# Patient Record
Sex: Female | Born: 1975 | Race: Black or African American | Hispanic: No | Marital: Single | Smoking: Current every day smoker
Health system: Southern US, Community
[De-identification: ages and names within clinical notes are randomized; demographics above are authoritative.]

## PROBLEM LIST (undated history)

## (undated) DIAGNOSIS — E0590 Thyrotoxicosis, unspecified without thyrotoxic crisis or storm: Secondary | ICD-10-CM

## (undated) DIAGNOSIS — I10 Essential (primary) hypertension: Secondary | ICD-10-CM

## (undated) HISTORY — PX: FINGER SURGERY: SHX640

## (undated) HISTORY — DX: Essential (primary) hypertension: I10

## (undated) HISTORY — PX: ABLATION: SHX5711

## (undated) HISTORY — DX: Thyrotoxicosis, unspecified without thyrotoxic crisis or storm: E05.90

## (undated) HISTORY — PX: APPENDECTOMY: SHX54

## (undated) HISTORY — PX: CYSTECTOMY: SUR359

---

## 2006-07-23 ENCOUNTER — Ambulatory Visit: Payer: Self-pay | Admitting: Obstetrics and Gynecology

## 2006-07-27 ENCOUNTER — Ambulatory Visit (HOSPITAL_COMMUNITY): Admission: RE | Admit: 2006-07-27 | Discharge: 2006-07-27 | Payer: Self-pay | Admitting: Obstetrics and Gynecology

## 2006-08-02 ENCOUNTER — Inpatient Hospital Stay (HOSPITAL_COMMUNITY): Admission: AD | Admit: 2006-08-02 | Discharge: 2006-08-02 | Payer: Self-pay | Admitting: Obstetrics and Gynecology

## 2006-08-04 ENCOUNTER — Inpatient Hospital Stay (HOSPITAL_COMMUNITY): Admission: AD | Admit: 2006-08-04 | Discharge: 2006-08-04 | Payer: Self-pay | Admitting: Obstetrics and Gynecology

## 2006-08-12 ENCOUNTER — Ambulatory Visit: Payer: Self-pay | Admitting: Obstetrics and Gynecology

## 2006-09-29 ENCOUNTER — Ambulatory Visit: Payer: Self-pay | Admitting: Obstetrics and Gynecology

## 2006-09-29 ENCOUNTER — Ambulatory Visit (HOSPITAL_COMMUNITY): Admission: RE | Admit: 2006-09-29 | Discharge: 2006-09-29 | Payer: Self-pay | Admitting: Obstetrics and Gynecology

## 2006-10-08 ENCOUNTER — Ambulatory Visit: Payer: Self-pay | Admitting: Obstetrics and Gynecology

## 2006-10-14 ENCOUNTER — Ambulatory Visit: Payer: Self-pay | Admitting: Obstetrics and Gynecology

## 2007-01-13 ENCOUNTER — Encounter: Payer: Self-pay | Admitting: Obstetrics & Gynecology

## 2007-01-13 ENCOUNTER — Ambulatory Visit: Payer: Self-pay | Admitting: Obstetrics & Gynecology

## 2007-10-12 ENCOUNTER — Ambulatory Visit: Payer: Self-pay | Admitting: Obstetrics and Gynecology

## 2007-10-12 ENCOUNTER — Other Ambulatory Visit: Admission: RE | Admit: 2007-10-12 | Discharge: 2007-10-12 | Payer: Self-pay | Admitting: Obstetrics and Gynecology

## 2009-05-23 ENCOUNTER — Ambulatory Visit: Payer: Self-pay | Admitting: Obstetrics & Gynecology

## 2009-05-23 ENCOUNTER — Encounter: Payer: Self-pay | Admitting: Family Medicine

## 2009-05-23 LAB — CONVERTED CEMR LAB

## 2009-05-24 ENCOUNTER — Encounter: Payer: Self-pay | Admitting: Family Medicine

## 2009-05-24 LAB — CONVERTED CEMR LAB: GC Probe Amp, Genital: NEGATIVE

## 2009-05-29 ENCOUNTER — Ambulatory Visit (HOSPITAL_COMMUNITY): Admission: RE | Admit: 2009-05-29 | Discharge: 2009-05-29 | Payer: Self-pay | Admitting: Obstetrics & Gynecology

## 2009-06-27 ENCOUNTER — Ambulatory Visit: Payer: Self-pay | Admitting: Obstetrics and Gynecology

## 2010-06-24 NOTE — Group Therapy Note (Signed)
Andrea [name], Andrea [name] NO.:  [id number]   MEDICAL RECORD NO.:  [id number]          PATIENT TYPE:  WOC   LOCATION:  [facility]                   FACILITY:  [facility]   PHYSICIAN:  Argentina Donovan, MD        DATE OF BIRTH:  1976-02-04   DATE OF SERVICE:  08/12/2006                                  CLINIC NOTE   The patient is a 35 year old African American female gravida 0, para 0-0-  0-0, who was seen on the 16th of this month in the clinic. Sent for an  ultrasound to evaluate fibroids. They were unable here to get an H&H  because of her poor veins, but since that time she has been in the [facility]  twice complaining of heavy bleeding and dysmenorrhea.  She was seen in  there on June 23, at which time she had a hemoglobin 9.6 and the 25th  hemoglobin 8.4. A significant drop, but she started on oral  contraceptives (unable to tolerate Depo-Provera) and the bleeding has  abated at this time. She started on iron. She had been taking it  irregularly. We are encouraging her to take it daily and have given a  prescription today for some iron. We reviewed her ultrasound, which  showed a 16-cm uterus with a supple mucosal component tenting the  endometrial cavity.  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 The  largest fibroid measures 5.3 cm.  We have talked to this the patient  about options and she decided that we will continue on the oral  contraceptives.  We will schedule her for an endometrial ablation in 6  weeks, which we can always cancel if the bleeding is controlled by the  medication. She is nulligravida.  However, doubtful that she will ever  get pregnant without significant intervention and the possibility of  stopping or canceling the endometrial ablation if she continues to have  amenorrhea. In addition, if this is not successful, we may have to  operate on her in which case I told her we would do  the abdominal  hysterectomy,  bilateral possible with or without removing her ovaries  if the inflammation were bad enough.   IMPRESSION:  Menometrorrhagia probably secondary to fibroids with  hydrosalpinx and anemia and a questionable complex mass on the right  ovary, which will have to be reevaluated when she calls. Will go ahead  and repeat her ultrasound to reevaluate that or get it at that time of  the ablation.           ______________________________  Argentina Donovan, MD     PR/MEDQ  D:  08/12/2006  T:  08/12/2006  Job:  [id number]

## 2010-06-24 NOTE — Group Therapy Note (Signed)
Andrea [name], [name] NO.:  [id number]   MEDICAL RECORD NO.:  [id number]          PATIENT TYPE:  WOC   LOCATION[facility]  [facility]                   FACILITY:  [facility]   PHYSICIAN:  Argentina Donovan, MD        DATE OF BIRTH:  1975-12-11   DATE OF SERVICE:                                  CLINIC NOTE   CHIEF COMPLAINT:  Evaluation for fibroids.   HISTORY OF PRESENT ILLNESS:  The patient is a 35 year old G0, P0, who  presents for very painful periods and very heavy periods monthly for as  long as she can remember.  The patient states she is unable to hold a  job as she is forced to miss work multiple days a month due to her  symptoms.  Her bleeding lasts from 7 to 10 days and she has 21-day  cycles.  The patient had an ultrasound last year in [place] showing  fibroids.  Per report, the patient states she was told they were small,  multiple fibroids, but she is unaware of the size of the largest.  The  patient currently has BV that was diagnosed at the health department on  Jul 07, 2006, and is taking Cleocin nightly.  The patient is interested  in discussing potential surgical options for treatment of her fibroids.  She has not, in the past, been treated with hormonal therapy.   PAST MEDICAL HISTORY:  Significant for anemia secondary to menorrhagia.   PAST SURGICAL HISTORY:  Significant for appendectomy and a cyst removed  under the patient's chin.   FAMILY HISTORY:  Positive for CVA in her maternal grandmother, diabetes  in her paternal grandmother, and diabetes in her sister, who was  diagnosed at the age of 60.   SOCIAL HISTORY:  The patient is a half pack per day smoker.  She drinks  3 beers monthly.  She denies drug use.  She is currently unemployed and  living with friends at this time.   GYN HISTORY:  The patient is a G0, P0 who had menarche at age 52.  Her  menses last 7 to 10 days and she has regular 21-day cycles.   PHYSICAL EXAM:  The patient has no cervical  motion tenderness, no  adnexal tenderness or fullness.  She has a firm uterus.  Size is within  normal limits.   ASSESSMENT AND PLAN:  Menorrhagia.  The patient suspects that her  menorrhagia is secondary to fibroids that were identified on ultrasound  last year in [place].  At this time, we have no ultrasound to review.  Plan to proceed with ultrasound evaluation prior to determining that her  bleeding is due to fibroids.  In the interim, the patient will start on  Loestrin 24 FE to help with her symptoms of menorrhagia.  The patient  states, at this time, she would prefer oral contraceptives rather than a  Mirena IUD, but she will think about the Mirena IUD in the future.  The  patient is  to return in 2 to 3 weeks to follow up her ultrasound results.  At this  time, we will also check a CBC to determine her blood count.           ______________________________  Argentina Donovan, MD     PR/MEDQ  D:  07/23/2006  T:  07/23/2006  Job:  [id number]

## 2010-06-24 NOTE — Group Therapy Note (Signed)
Andrea [name], Andrea [name] NO.:  [id number]   MEDICAL RECORD NO.:  [id number]          PATIENT TYPE:  WOC   LOCATION[facility]                   [facility]ACILITY[facility]   PHYSICIAN:  Argentina Donovan, MD        DATE OF BIRTH:  1975-05-02   DATE OF SERVICE:  10/12/2007                                  CLINIC NOTE   The patient is a 35 year old African American female, nulligravida, who  we did a successful endometrial ablation on a little over a year ago.  She had a LSIL Pap smear a year ago, had an unsatisfactory colposcopy,  and had a repeat Pap in December which was completely normal.  Her one-  year Pap done in June showed ASCUS with high-grade HPV.  Once again, the  colposcopy because I could not see the transition zone in the anterior  lip I will have to call unsatisfactory; however, I saw nothing  significant on the exocervix and did an endocervical curettage.  When  the endocervical curettage comes back, unless it shows something  alarming, I will repeat the Pap smear in 6 months.  I will call the  patient and let her know this.  I have told her that we will give her a  call with the results, but I do not think that it is necessary for her  to come back for results.  I saw nothing alarming on the exocervix at  all and this included ASCUS; only after following a normal Pap smear 6  months ago that there is no risk in waiting another 6 months and  repeating the Pap smear.   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           ______________________________  Argentina Donovan, MD     PR/MEDQ  D:  10/12/2007  T:  10/12/2007  Job:  [id number]

## 2010-06-24 NOTE — Group Therapy Note (Signed)
Andrea [name], ARCHIBEQUE                ACCOUNT NO.:  [id number]   MEDICAL RECORD NO.:  [id number]          PATIENT TYPE:  WOC   LOCATION[facility]                   [facility]   PHYSICIAN:  Elsie Lincoln, MD      DATE OF BIRTH:  1975-05-22   DATE OF SERVICE:  01/13/2007                                  CLINIC NOTE   HISTORY OF PRESENT ILLNESS:  The patient is a 35 year old female who  presents 3 months after endometrial ablation for menorrhagia, who  previously had a hemoglobin of 7. She did not have a period for 2 months  and has now had a little bit of light periods each month or 2 months.  She has been very  happy with the procedure. I did go back and look at  her health care maintenance and she did have an LSL Pap smear at the  [facility] in May. She said that she did undergo colposcopy but  they did not find anything. It has been 6 months, so I thought we should  repeat the Pap smear because she does not remember if she a followup.  The patient was reassured that having a period after endometrial  ablation could be normal because not all of them are amenorrheic after  the procedure. The patient will come back in 6 months for Pap smear and  __________ after the ablation in 6 months.           ______________________________  Elsie Lincoln, MD     KL/MEDQ  D:  01/13/2007  T:  01/13/2007  Job:  [id number]

## 2010-06-24 NOTE — Op Note (Signed)
NAMELAINE, FONNER                ACCOUNT NO.:  [id number]   MEDICAL RECORD NO.:  [id number]          PATIENT TYPE:  AMB   LOCATION:  [facility]                           [facility]   PHYSICIAN:  Phil D. Okey Dupre, M.D.     DATE OF BIRTH:  [birth year]   DATE OF PROCEDURE:  09/29/2006  DATE OF DISCHARGE:                               OPERATIVE REPORT   PROCEDURE:  Hysteroscopy and hydrothermal ablation of the endometrium.   PREOPERATIVE DIAGNOSIS:  Menorrhagia and fibroids.   POSTOPERATIVE DIAGNOSIS:  Menorrhagia and fibroids.   SURGEON:  Dr. Okey Dupre   ANESTHESIA:  General plus local.   FINDINGS:  Were submucous fibroids pathology.   SPECIMENS:  None.   ESTIMATED BLOOD LOSS:  Minimal.   COMPLICATIONS:  No complications.   POSTOPERATIVE CONDITION:  Satisfactory.   DESCRIPTION OF PROCEDURE:  Under satisfactory general anesthesia the  patient in dorsal lithotomy position, perineum, vagina prepped and  draped in usual sterile manner.  Bimanual pelvic examination under  anesthesia revealed the uterus in first degree retroversion, slightly  enlarged, and of normal shape, consistency.  Weighted speculum placed  posterior fourchette of the vagina.  Anterior lip of the cervix grasped  with single-tooth tenaculum.  Uterine cavity sounded to a depth of 8 cm.  The cervical os dilated to #7 Hagar dilator and the hysteroscope  inserted into the uterine cavity to the fundus. The entire cavity was  easily visualized and there were several submucous leiomyomata uteri  which were probably the causes of the patient's bleeding. Other than  that the endometrium appeared normal.  The 10 mL of 1% Xylocaine was  injected at 4 o'clock and 10 mL at 8 o'clock in the paracervical area  for postoperative patient comfort.  The hydroablation procedure was  proceeded. The fluid level was filled up to the appropriate amount and  no leakage was noted and the heating occurred to 88 degrees centigrade  which varied  between that and 92 for 10 minutes. Blanching and shrinkage  of the fibroids was easily noted and photographed.  Cooling down after 10 minutes occurred and the scope was removed.  There  was no apparent leakage on a 4x4 during the procedure and the patient  tolerated the procedure well.  The tenaculum was removed from the cervix  and the patient transferred to recovery room in satisfactory condition  having tolerated the procedure well.      Phil D. Okey Dupre, M.D.  Electronically Signed     PDR/MEDQ  D:  09/29/2006  T:  09/29/2006  Job:  [id number]

## 2010-11-21 LAB — APTT: aPTT: 27

## 2010-11-21 LAB — URINALYSIS, ROUTINE W REFLEX MICROSCOPIC
Bilirubin Urine: NEGATIVE
Glucose, UA: NEGATIVE
Hgb urine dipstick: NEGATIVE
Ketones, ur: NEGATIVE
Urobilinogen, UA: 0.2
pH: 6.5

## 2010-11-21 LAB — CBC
HCT: 28.1 — ABNORMAL LOW
Hemoglobin: 9.2 — ABNORMAL LOW
MCV: 69.3 — ABNORMAL LOW
Platelets: 288
RDW: 25 — ABNORMAL HIGH

## 2010-11-26 LAB — URINALYSIS, ROUTINE W REFLEX MICROSCOPIC
Bilirubin Urine: NEGATIVE
Glucose, UA: NEGATIVE
Hgb urine dipstick: NEGATIVE
Leukocytes, UA: NEGATIVE
Protein, ur: NEGATIVE

## 2010-11-26 LAB — CBC
HCT: 26.2 — ABNORMAL LOW
HCT: 30.5 — ABNORMAL LOW
Hemoglobin: 8.4 — ABNORMAL LOW
MCHC: 31.9
MCV: 72.8 — ABNORMAL LOW
Platelets: 283
RBC: 3.6 — ABNORMAL LOW
RBC: 4.16
RDW: 33.4 — ABNORMAL HIGH
WBC: 6.5
WBC: 6.8

## 2010-11-26 LAB — URINE MICROSCOPIC-ADD ON

## 2010-11-26 LAB — GC/CHLAMYDIA PROBE AMP, GENITAL: GC Probe Amp, Genital: NEGATIVE

## 2010-11-26 LAB — PREGNANCY, URINE: Preg Test, Ur: NEGATIVE

## 2017-10-15 ENCOUNTER — Encounter: Payer: Self-pay | Admitting: *Deleted

## 2017-11-17 ENCOUNTER — Encounter: Payer: Self-pay | Admitting: Obstetrics and Gynecology

## 2017-11-17 ENCOUNTER — Ambulatory Visit (INDEPENDENT_AMBULATORY_CARE_PROVIDER_SITE_OTHER): Payer: Self-pay | Admitting: Obstetrics and Gynecology

## 2017-11-17 VITALS — BP 124/79 | HR 79 | Wt 142.5 lb

## 2017-11-17 DIAGNOSIS — Z1331 Encounter for screening for depression: Secondary | ICD-10-CM

## 2017-11-17 DIAGNOSIS — D219 Benign neoplasm of connective and other soft tissue, unspecified: Secondary | ICD-10-CM

## 2017-11-17 NOTE — Progress Notes (Signed)
GYNECOLOGY OFFICE FOLLOW UP NOTE  History:  42 y.o. G0P0000 here today for follow up for fibroids. She is having discomfort, but no significant pain. She reports an "abnormal" feeling in abdomen. Reports stomach has gotten bigger over last year, she has noted that she feels like she looks 4 months pregnant. Thinks she has always had a "slight stomach" but is noticeable now.  S/p endometrial ablation 2008. Had regular periods after ablation. Stopped having periods in 2016. Not on any hormonal contraception, has not had BTL. Is sexually active.   Mammogram 04/2017: Birads 1 Pap: 07/2015 negative and negative hrHPV, patient reports she had one done in early this year  Per records from [facility], patient had HIV, RPR, GC/CT done this year. Do not have results from those tests. Patient has not been todl they are abnormal.   Past Medical History:  Diagnosis Date  . Hypertension   . Hyperthyroidism    Past Surgical History:  Procedure Laterality Date  . ABLATION    . APPENDECTOMY    . CYSTECTOMY     chin   . FINGER SURGERY      Current Outpatient Medications:  .  amLODipine (NORVASC) 10 MG tablet, Take 10 mg by mouth daily., Disp: , Rfl:  .  Ergocalciferol (VITAMIN D2 PO), Take 1.25 mg by mouth once a week., Disp: , Rfl:  .  methimazole (TAPAZOLE) 10 MG tablet, Take 10 mg by mouth 2 (two) times daily., Disp: , Rfl:   The following portions of the patient's history were reviewed and updated as appropriate: allergies, current medications, past family history, past medical history, past social history, past surgical history and problem list.   Review of Systems:  Pertinent items noted in HPI and remainder of comprehensive ROS otherwise negative.   Objective:  Physical Exam BP 124/79   Pulse 79   Wt 142 lb 8 oz (64.6 kg)  CONSTITUTIONAL: Well-developed, well-nourished female in no acute distress.  HENT:  Normocephalic, atraumatic. External right and left ear normal. Oropharynx is clear  and moist EYES: Conjunctivae and EOM are normal. Pupils are equal, round, and reactive to light. No scleral icterus.  NECK: Normal range of motion, supple, no masses SKIN: Skin is warm and dry. No rash noted. Not diaphoretic. No erythema. No pallor. NEUROLOGIC: Alert and oriented to person, place, and time. Normal reflexes, muscle tone coordination. No cranial nerve deficit noted. PSYCHIATRIC: Normal mood and affect. Normal behavior. Normal judgment and thought content. CARDIOVASCULAR: Normal heart rate noted RESPIRATORY: Effort normal, no problems with respiration noted ABDOMEN: Soft, no distention noted. Large fibroid, mobile uterus measuring up to 30 weeks sized, multiple fibroids palpable, mild diffuse tenderness with deep palpation  PELVIC: Normal appearing external genitalia; normal appearing vaginal mucosa and cervix.  No abnormal discharge noted.  Enlarged multifibroid uterus, mobile and mildly tender diffusely, no significant uterine tenderness, no adnexal masses or tenderness. MUSCULOSKELETAL: Normal range of motion. No edema noted.  Labs and Imaging No results found.  Assessment & Plan:   1. Fibroids -Large multi-fibroid uterus. Reviewed low likelihood of malignancy, reviewed etiology and course with fibroids, however as patient reports they have enlarged quickly, reviewed need for evaluation. Reviewed that with as large as they are, recommended management would be hysterectomy - she will return for management planning after ultrasound - US Pelvis Complete; Future - US Transvaginal Non-OB; Future  2. Positive depression screening Declines to see [facility] today - Ambulatory referral to [facility]  Routine preventative health maintenance  measures emphasized. Please refer to After Visit Summary for other counseling recommendations.   Return in about 4 weeks (around 12/15/2017) for Followup.   Feliz Beam, M.D. [facility]

## 2017-11-17 NOTE — Progress Notes (Signed)
[place] scheduled for October 14th @ 1030.  Pt notified.

## 2017-11-18 ENCOUNTER — Encounter: Payer: Self-pay | Admitting: Obstetrics and Gynecology

## 2017-11-22 ENCOUNTER — Ambulatory Visit (HOSPITAL_BASED_OUTPATIENT_CLINIC_OR_DEPARTMENT_OTHER): Payer: Self-pay

## 2020-08-25 ENCOUNTER — Encounter (HOSPITAL_BASED_OUTPATIENT_CLINIC_OR_DEPARTMENT_OTHER): Payer: Self-pay

## 2020-08-25 ENCOUNTER — Ambulatory Visit (HOSPITAL_BASED_OUTPATIENT_CLINIC_OR_DEPARTMENT_OTHER): Admission: RE | Admit: 2020-08-25 | Payer: Self-pay | Source: Ambulatory Visit

## 2020-08-25 ENCOUNTER — Other Ambulatory Visit: Payer: Self-pay

## 2020-08-25 ENCOUNTER — Emergency Department (HOSPITAL_BASED_OUTPATIENT_CLINIC_OR_DEPARTMENT_OTHER)
Admission: EM | Admit: 2020-08-25 | Discharge: 2020-08-25 | Disposition: A | Discharge status: Home / Self Care | Payer: Self-pay | Attending: Emergency Medicine | Admitting: Emergency Medicine

## 2020-08-25 ENCOUNTER — Ambulatory Visit (HOSPITAL_BASED_OUTPATIENT_CLINIC_OR_DEPARTMENT_OTHER)
Admission: RE | Admit: 2020-08-25 | Discharge: 2020-08-25 | Disposition: A | Discharge status: Home / Self Care | Payer: Self-pay | Source: Ambulatory Visit | Attending: Emergency Medicine | Admitting: Emergency Medicine

## 2020-08-25 ENCOUNTER — Emergency Department (HOSPITAL_BASED_OUTPATIENT_CLINIC_OR_DEPARTMENT_OTHER): Payer: Self-pay

## 2020-08-25 DIAGNOSIS — Z79899 Other long term (current) drug therapy: Secondary | ICD-10-CM | POA: Insufficient documentation

## 2020-08-25 DIAGNOSIS — I10 Essential (primary) hypertension: Secondary | ICD-10-CM | POA: Insufficient documentation

## 2020-08-25 DIAGNOSIS — R609 Edema, unspecified: Secondary | ICD-10-CM

## 2020-08-25 DIAGNOSIS — R599 Enlarged lymph nodes, unspecified: Secondary | ICD-10-CM | POA: Insufficient documentation

## 2020-08-25 DIAGNOSIS — R600 Localized edema: Secondary | ICD-10-CM | POA: Insufficient documentation

## 2020-08-25 LAB — BASIC METABOLIC PANEL
Anion gap: 6 (ref 5–15)
BUN: 10 mg/dL (ref 6–20)
CO2: 25 mmol/L (ref 22–32)
Calcium: 8 mg/dL — ABNORMAL LOW (ref 8.9–10.3)
Chloride: 106 mmol/L (ref 98–111)
Creatinine, Ser: 0.8 mg/dL (ref 0.44–1.00)
GFR, Estimated: >60 mL/min (ref >60)
Glucose, Bld: 91 mg/dL (ref 70–99)
Potassium: 3.7 mmol/L (ref 3.5–5.1)
Sodium: 137 mmol/L (ref 135–145)

## 2020-08-25 LAB — CBC
HCT: 35 % — ABNORMAL LOW (ref 36.0–46.0)
Hemoglobin: 12.3 g/dL (ref 12.0–15.0)
MCH: 32.5 pg (ref 26.0–34.0)
MCHC: 35.1 g/dL (ref 30.0–36.0)
MCV: 92.3 fL (ref 80.0–100.0)
Platelets: 233 10*3/uL (ref 150–400)
RBC: 3.79 MIL/uL — ABNORMAL LOW (ref 3.87–5.11)
RDW: 12.8 % (ref 11.5–15.5)
WBC: 4.4 10*3/uL (ref 4.0–10.5)
nRBC: 0 % (ref 0.0–0.2)

## 2020-08-25 LAB — BRAIN NATRIURETIC PEPTIDE: B Natriuretic Peptide: 75.8 pg/mL (ref 0.0–100.0)

## 2020-08-25 LAB — D-DIMER, QUANTITATIVE: D-Dimer, Quant: 0.61 ug/mL-FEU — ABNORMAL HIGH (ref 0.00–0.50)

## 2020-08-25 NOTE — ED Triage Notes (Addendum)
Pt with leg swelling that started in the L leg 2 days ago and today swelling starting in the R. Pt reports calf pain to the L. Denies ShOB or CP. Denies long travel or long periods of sitting.

## 2020-08-25 NOTE — ED Provider Notes (Signed)
[facility] EM[facility]ENC[facility] DEPARTMENT Provider Note   CSN: [id number] Arrival date & time: 08/25/20  0052     History Chief Complaint  Patient presents with   Leg Swelling    Andrea [name] is a 45 y.o. female.  Patient presents to the emergency department for evaluation of swelling of her legs.  Patient first noted swelling in her left leg 2 days ago.  There is some pain in the lower leg now as well.  Today she started noticing some swelling in the right leg.  No history of congestive heart failure or peripheral edema.  Patient denies trauma to the area.  No chest pain or shortness of breath.      Past Medical History:  Diagnosis Date   Hypertension    Hyperthyroidism     There are no problems to display for this patient.   Past Surgical History:  Procedure Laterality Date   ABLATION     APPENDECTOMY     CYSTECTOMY     chin    FINGER SURGERY       OB History     Gravida  0   Para  0   Term  0   Preterm  0   AB  0   Living  0      SAB  0   IAB  0   Ectopic  0   Multiple  0   Live Births  0           No family history on file.  Social History   Tobacco Use   Smoking status: Every Day   Smokeless tobacco: Never  Vaping Use   Vaping Use: Never used  Substance Use Topics   Alcohol use: Yes    Comment: occasional    Drug use: Never    Home Medications Prior to Admission medications   Medication Sig Start Date End Date Taking? Authorizing Provider  amLODipine (NORVASC) 10 MG tablet Take 10 mg by mouth daily.    [provider]  Ergocalciferol (VITAMIN D2 PO) Take 1.25 mg by mouth once a week.    [provider]  methimazole (TAPAZOLE) 10 MG tablet Take 10 mg by mouth 2 (two) times daily.    [provider]    Allergies    Penicillins  Review of Systems   Review of Systems  Constitutional:  Negative for fever.  Respiratory:  Negative for shortness of breath.   Cardiovascular:  Positive for leg  swelling.  All other systems reviewed and are negative.  Physical Exam Updated Vital Signs BP (!) 160/88 (BP Location: Right Arm)   Pulse 73   Temp 98 F (36.7 C) (Oral)   Resp 20   Ht 5' 4.5" (1.638 m)   Wt 63.5 kg   SpO2 98%   BMI 23.66 kg/m   Physical Exam Vitals and nursing note reviewed.  Constitutional:      General: She is not in acute distress.    Appearance: Normal appearance. She is well-developed.  HENT:     Head: Normocephalic and atraumatic.     Right Ear: Hearing normal.     Left Ear: Hearing normal.     Nose: Nose normal.  Eyes:     Conjunctiva/sclera: Conjunctivae normal.     Pupils: Pupils are equal, round, and reactive to light.  Cardiovascular:     Rate and Rhythm: Regular rhythm.     Heart sounds: S1 normal and S2 normal. No murmur heard.  No friction rub. No gallop.  Pulmonary:     Effort: Pulmonary effort is normal. No respiratory distress.     Breath sounds: Normal breath sounds.  Chest:     Chest wall: No tenderness.  Abdominal:     General: Bowel sounds are normal.     Palpations: Abdomen is soft.     Tenderness: There is no abdominal tenderness. There is no guarding or rebound. Negative signs include Murphy's sign and McBurney's sign.     Hernia: No hernia is present.  Musculoskeletal:        General: Normal range of motion.     Cervical back: Normal range of motion and neck supple.     Left lower leg: Tenderness present. Edema present.  Skin:    General: Skin is warm and dry.     Findings: No rash.  Neurological:     Mental Status: She is alert and oriented to person, place, and time.     GCS: GCS eye subscore is 4. GCS verbal subscore is 5. GCS motor subscore is 6.     Cranial Nerves: No cranial nerve deficit.     Sensory: No sensory deficit.     Coordination: Coordination normal.  Psychiatric:        Speech: Speech normal.        Behavior: Behavior normal.        Thought Content: Thought content normal.    ED Results /  Procedures / Treatments   Labs (all labs ordered are listed, but only abnormal results are displayed) Labs Reviewed  CBC - Abnormal; Notable for the following components:      Result Value   RBC 3.79 (*)    HCT 35.0 (*)    All other components within normal limits  BASIC METABOLIC PANEL - Abnormal; Notable for the following components:   Calcium 8.0 (*)    All other components within normal limits  D-DIMER, QUANTITATIVE - Abnormal; Notable for the following components:   D-Dimer, Quant 0.61 (*)    All other components within normal limits  BRAIN NATRIURETIC PEPTIDE    EKG None  Radiology DG Chest 2 View  Result Date: 08/25/2020 CLINICAL DATA:  Bilateral lower extremity swelling x2 days EXAM: CHEST - 2 VIEW COMPARISON:  11/11/2016 FINDINGS: Lungs are essentially clear. No frank interstitial edema. No pleural effusion or pneumothorax. The heart is normal in size. Visualized osseous structures are within normal limits. IMPRESSION: Normal chest radiographs. Electronically Signed   By: Julian Hy M.D.   On: 08/25/2020 01:58    Procedures Procedures   Medications Ordered in ED Medications - No data to display  ED Course  I have reviewed the triage vital signs and the nursing notes.  Pertinent labs & imaging results that were available during my care of the patient were reviewed by me and considered in my medical decision making (see chart for details).    MDM Rules/Calculators/A&P                          Patient presents to the emergency department for evaluation of leg swelling.  Patient has had 2 days of left lower leg swelling followed by onset of swelling of the right leg today.  Patient does report some tenderness and pain in the calf area.  She does not have any chest pain or shortness of breath.  Patient has normal dorsalis pedal pulses bilaterally.  No overlying skin changes to suggest infection.  Lab work reveals normal CBC.  BNP is normal and chest x-ray is clear,  no concern for congestive heart failure.  D-dimer is slightly elevated, cannot rule out DVT.  Will require DVT evaluation in the morning. Final Clinical Impression(s) / ED Diagnoses Final diagnoses:  Peripheral edema    Rx / DC Orders ED Discharge Orders     None        Aibhlinn Kalmar, Gwenyth Allegra, MD 08/25/20 [phone]

## 2023-02-27 IMAGING — US US EXTREM LOW VENOUS*L*
1 series · 14 of 24 positions shown · non-contrast
Comparison: None.

CLINICAL DATA: Swelling, pain x4 days

EXAM:
LEFT LOWER EXTREMITY VENOUS DOPPLER ULTRASOUND
TECHNIQUE: Gray-scale sonography with compression, as well as color and duplex
ultrasound, were performed to evaluate the deep venous system(s)
from the level of the common femoral vein through the popliteal and
proximal calf veins.

[Series 1: us extrem low venous*left* · 14 of 47 slices shown]
[im 1/47]
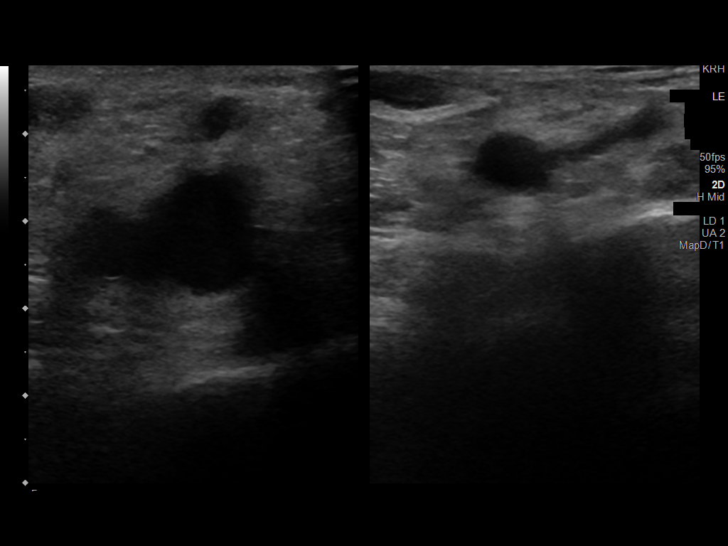
[im 5/47]
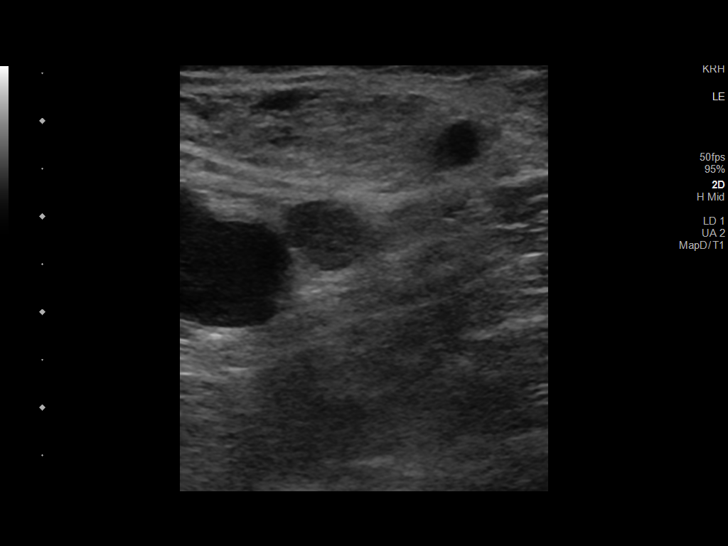
[im 9/47]
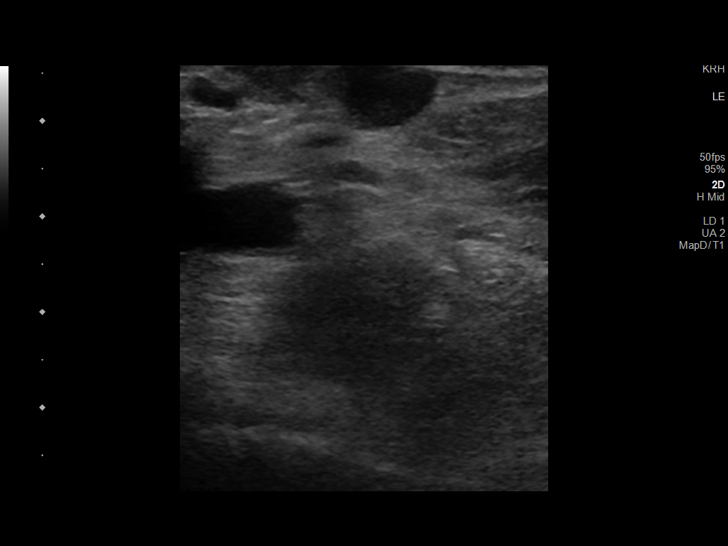
[im 13/47]
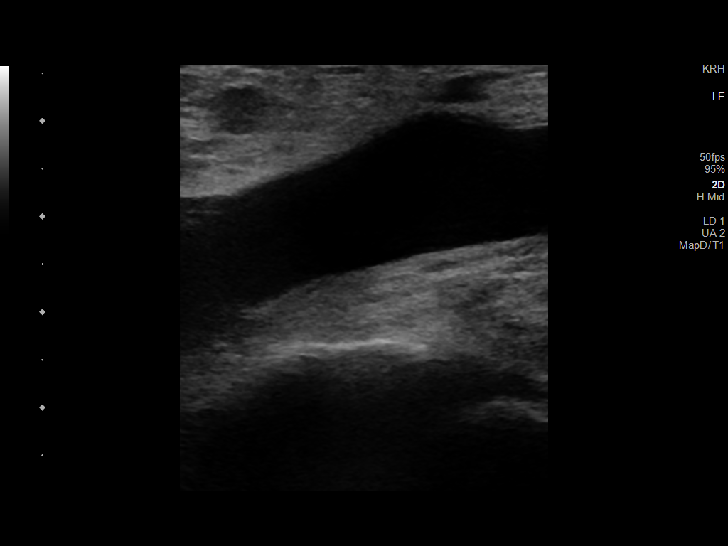
[im 15/47]
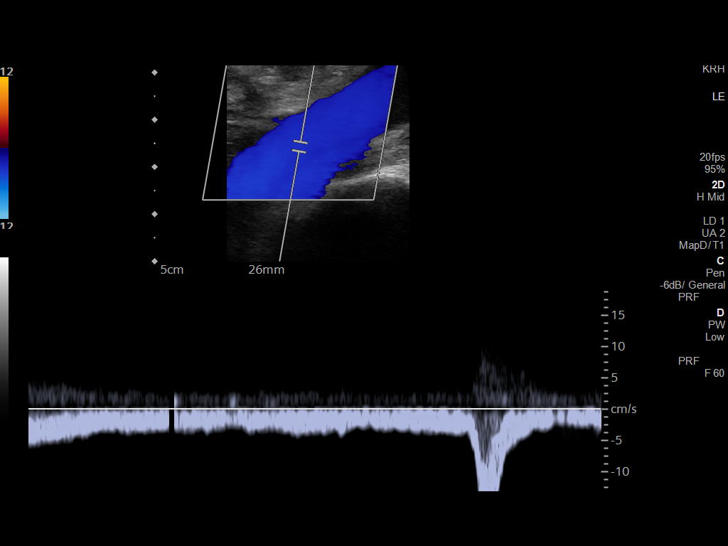
[im 19/47]
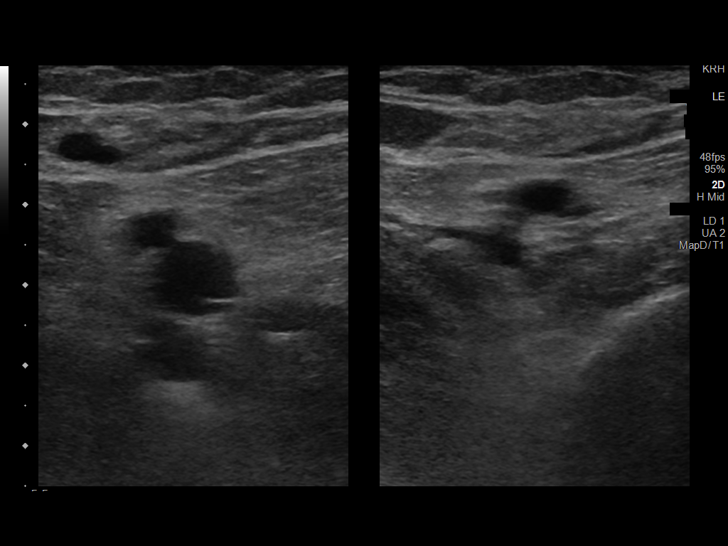
[im 23/47]
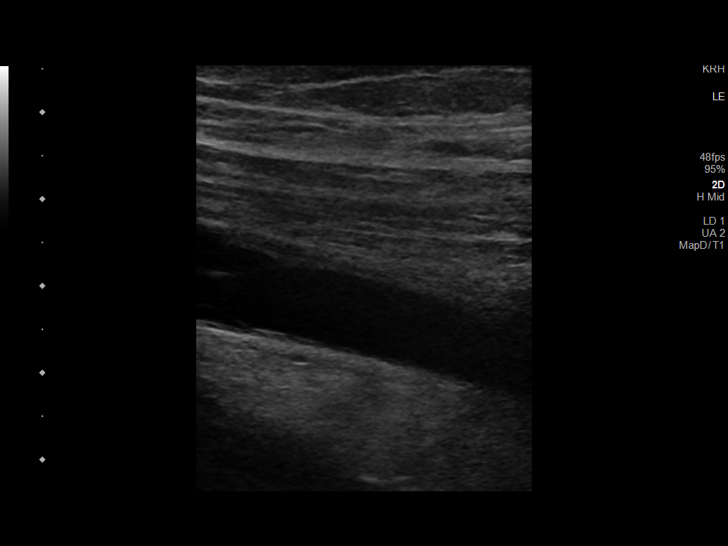
[im 25/47]
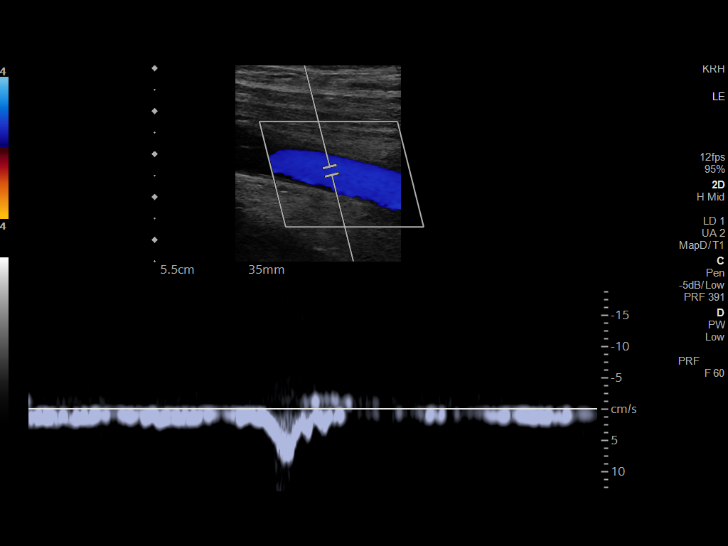
[im 29/47]
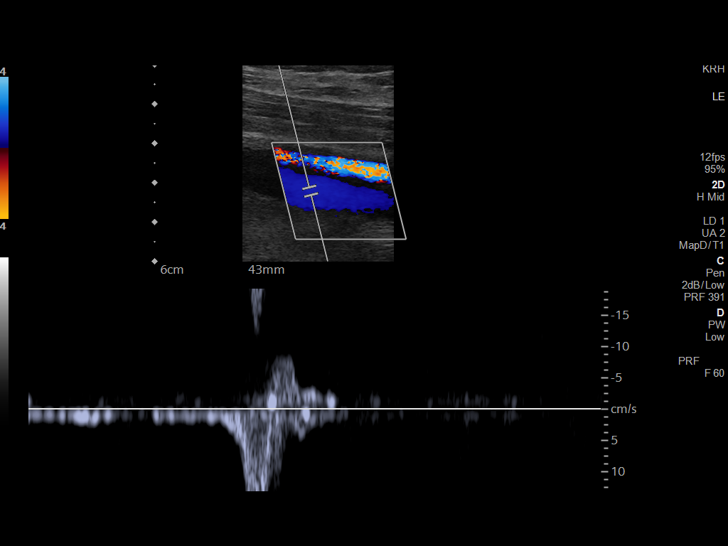
[im 33/47]
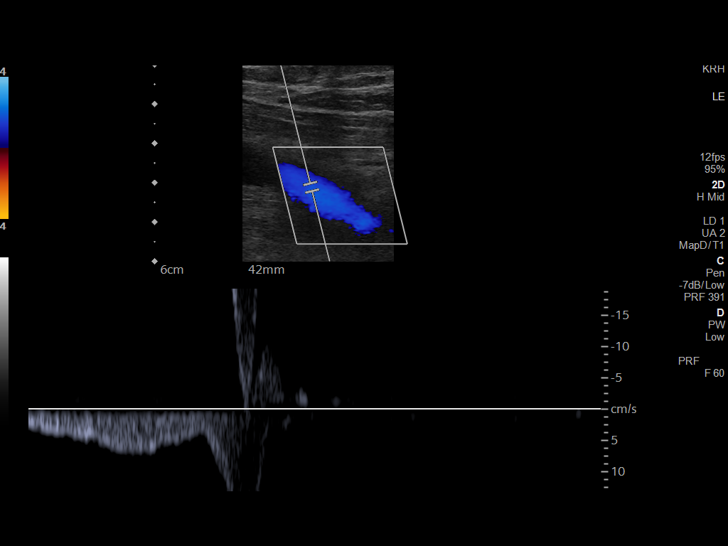
[im 37/47]
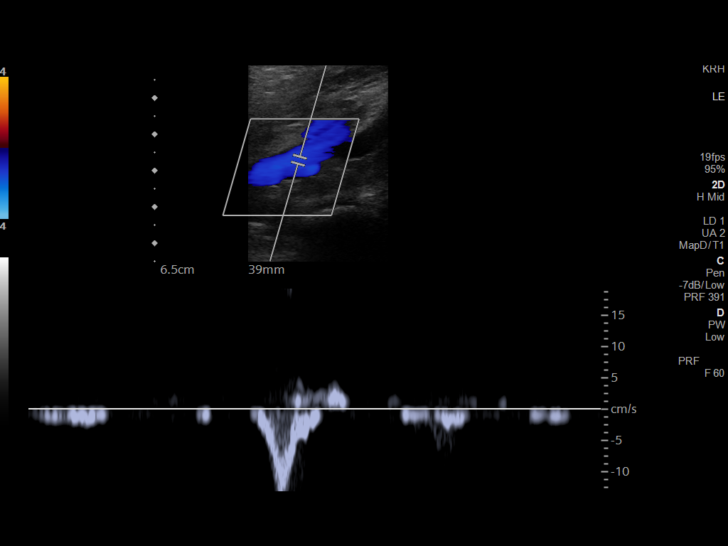
[im 39/47]
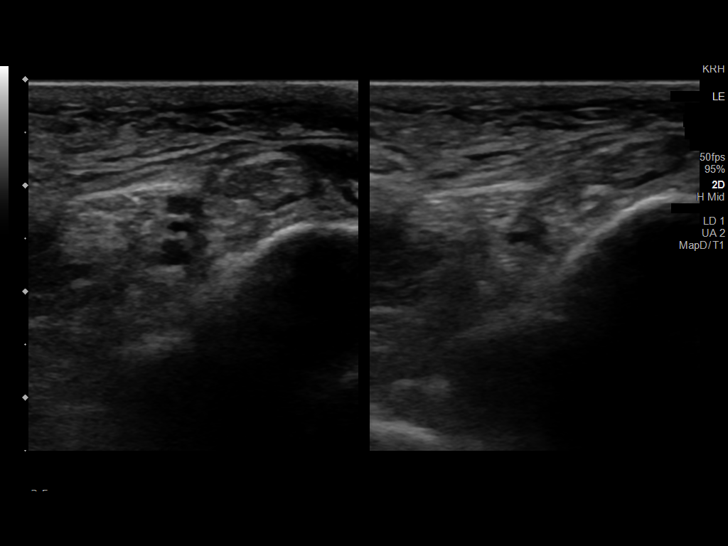
[im 43/47]
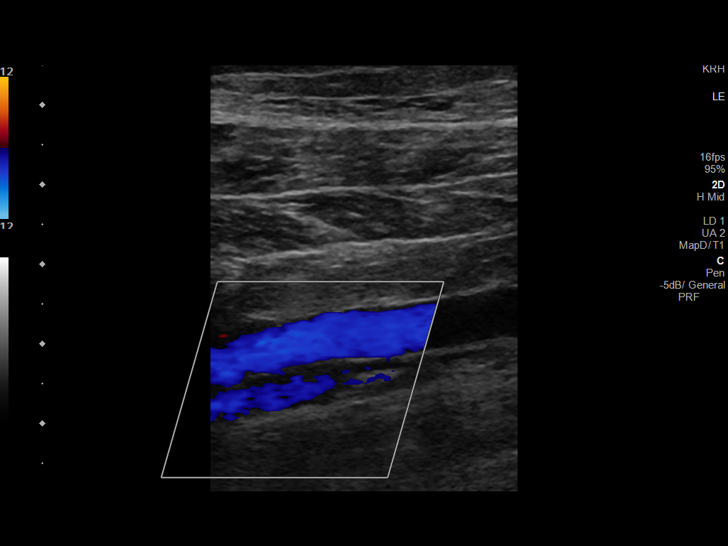
[im 47/47]
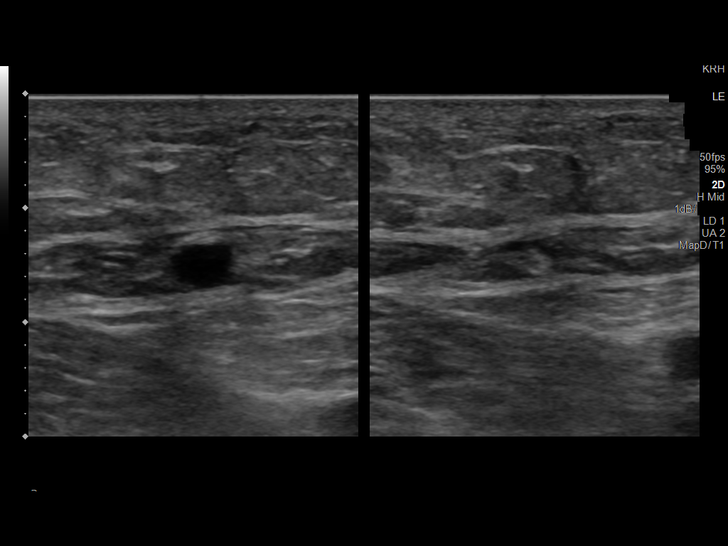

[14 of 24 positions shown; findings below may reference images not displayed]

FINDINGS: VENOUS

Normal compressibility of the common femoral, superficial femoral,
and popliteal veins, as well as the visualized calf veins.
Visualized portions of profunda femoral vein and great saphenous
vein unremarkable. No filling defects to suggest DVT on grayscale or
color Doppler imaging. Doppler waveforms show normal direction of
venous flow, normal respiratory phasicity and response to
augmentation.

Limited views of the contralateral common femoral vein are
unremarkable.

OTHER

Prominent bilateral inguinal lymph nodes measure up to 1.2 cm short
axis diameter right, 1.1 cm left. Left calf edema.

Limitations: none
IMPRESSION: 1. Negative for left lower extremity DVT.
2. Prominent bilateral inguinal lymph nodes, possibly reactive but
nonspecific.

## 2023-02-27 IMAGING — CR DG CHEST 2V
2 series · 2 of 2 positions shown · non-contrast
Comparison: 11/11/2016

CLINICAL DATA: Bilateral lower extremity swelling x2 days

EXAM:
CHEST - 2 VIEW

[w chest pa]
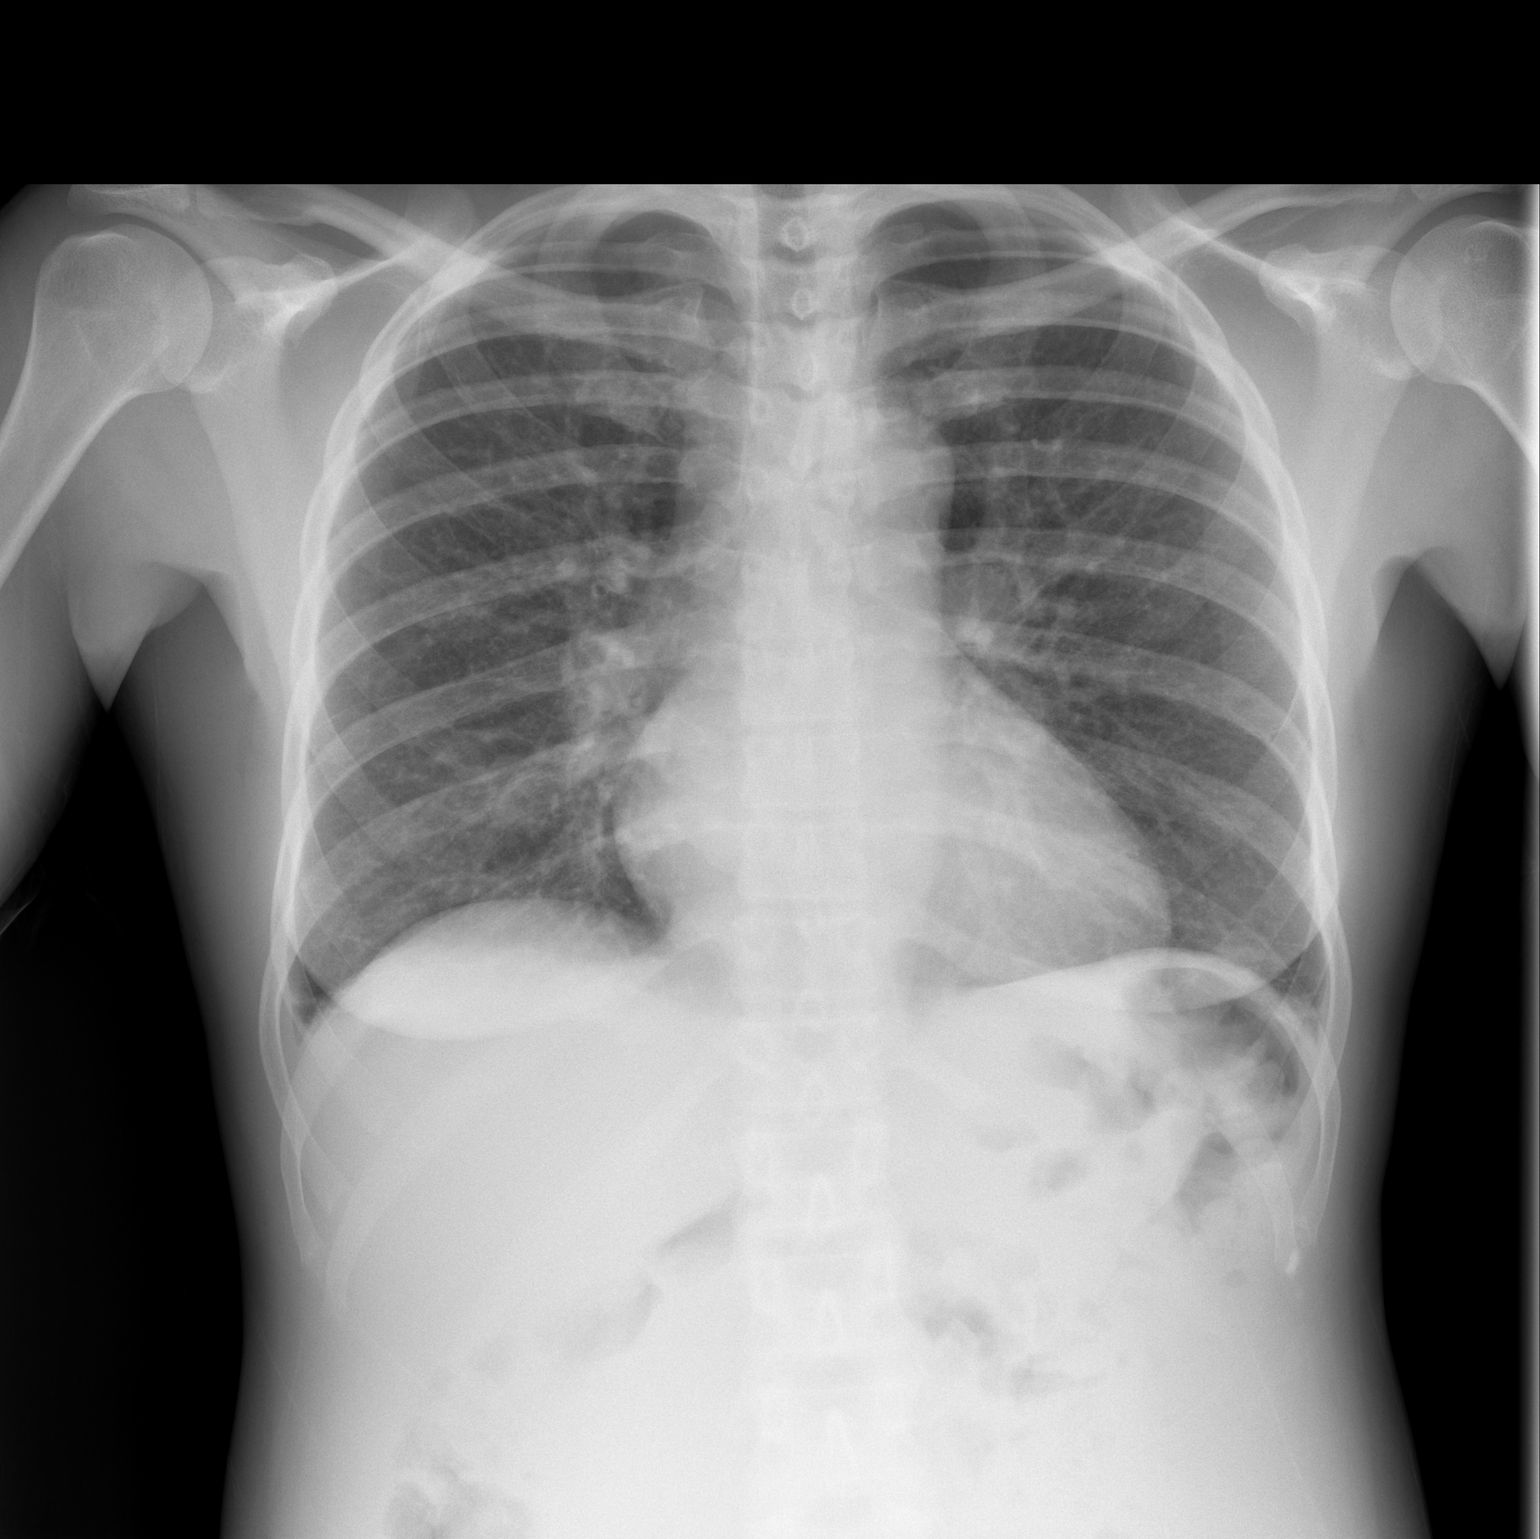

[w chest lat]
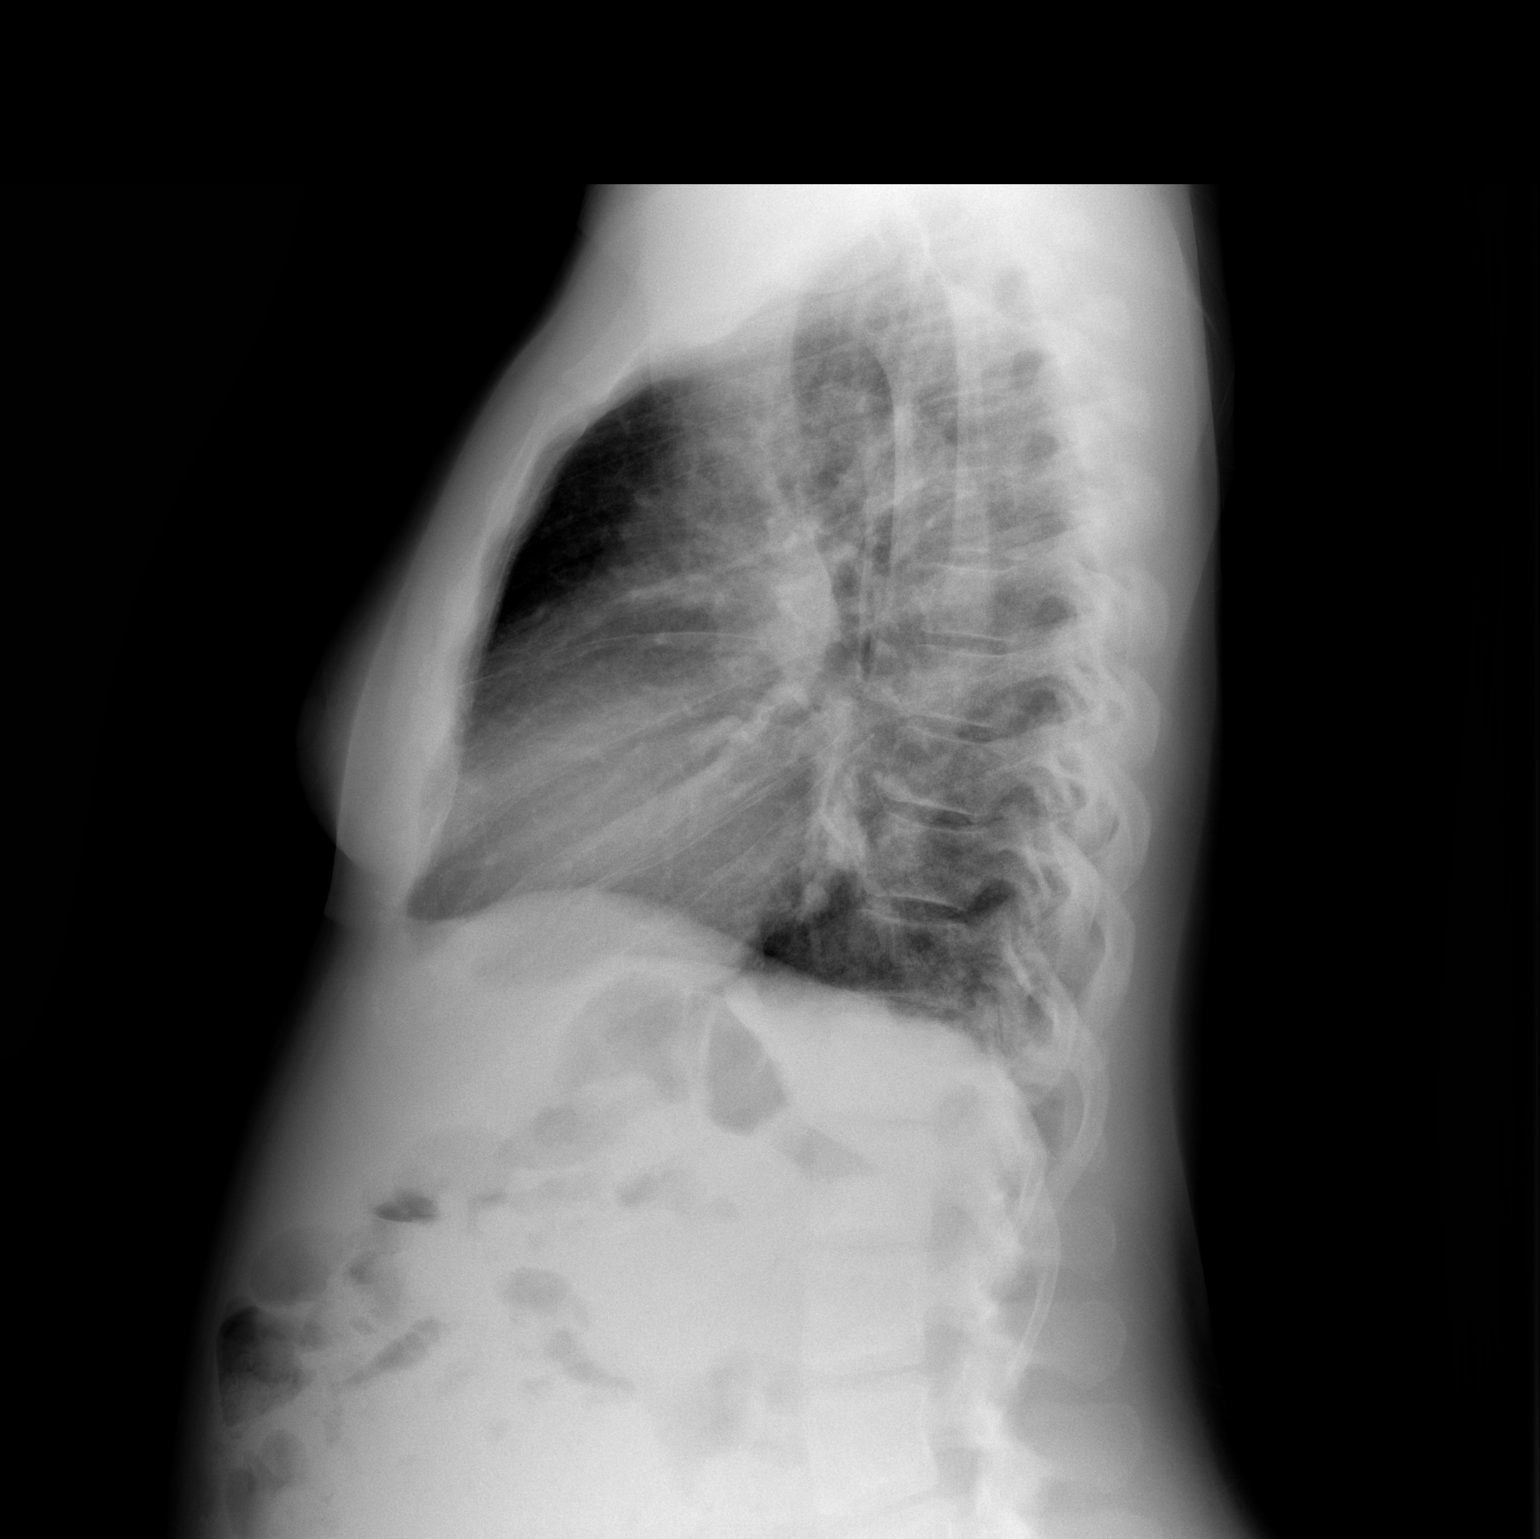

[2 of 2 positions shown; findings below may reference images not displayed]

FINDINGS: Lungs are essentially clear. No frank interstitial edema. No pleural
effusion or pneumothorax.

The heart is normal in size.

Visualized osseous structures are within normal limits.
IMPRESSION: Normal chest radiographs.
